# Patient Record
Sex: Male | Born: 2000 | Race: White | Hispanic: No | Marital: Single | Smoking: Never smoker
Health system: Southern US, Community
[De-identification: ages and names within clinical notes are randomized; demographics above are authoritative.]

---
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
## 2018-02-13 ENCOUNTER — Other Ambulatory Visit: Payer: Self-pay | Admitting: Family Medicine

## 2018-02-13 DIAGNOSIS — G43909 Migraine, unspecified, not intractable, without status migrainosus: Secondary | ICD-10-CM

## 2018-02-23 ENCOUNTER — Encounter: Payer: Self-pay | Admitting: Neurology

## 2018-02-24 ENCOUNTER — Ambulatory Visit
Admission: RE | Admit: 2018-02-24 | Discharge: 2018-02-24 | Disposition: A | Discharge status: Home / Self Care | Payer: Commercial Managed Care - PPO | Source: Ambulatory Visit | Attending: Family Medicine | Admitting: Family Medicine

## 2018-02-24 DIAGNOSIS — G43909 Migraine, unspecified, not intractable, without status migrainosus: Secondary | ICD-10-CM

## 2018-03-02 NOTE — Progress Notes (Signed)
NEUROLOGY CONSULTATION NOTE  Randall Chaney MRN: [id number] DOB: 2001/04/29  Referring provider: Nadyne CoombesKaren Richter, MD Primary care provider: Nadyne CoombesKaren Richter, MD  Reason for consult:  migraine  HISTORY OF PRESENT ILLNESS: Randall Chaney is a 17 year old  male who presents for migraines.  History supplemented by referring provider's note.  He is accompanied by his mother who supplements history.  Onset:  Over a year but worse over past few months.  They have become more severe. Location:  bifrontal Quality:  pressure Intensity:  Severe.  He denies thunderclap headache or severe headache that wakes him from sleep. Aura:  no Prodrome:  no Postdrome:  no Associated symptoms:  Nausea (sometimes precedes headache), sometimes vomit, photophobia, phonophobia, turns pale.  He denies associated osmophobia, autonomic symptoms, unilateral numbness or weakness. Duration:  All day Frequency:  Once or twice a month. Last severe migraine was 3 weeks ago but has since had a lingering mild headache with occasional flare ups lasting an hour and occurring once or twice a week. Frequency of abortive medication: 0 Triggers:  No Exacerbating factors:  Lights, sounds, movement, laying on left side Relieving factors:  Being still, sleep Activity:  Aggravates.  Cannot go to schoool  CT Head without contrast from 02/25/18 personally reviewed and demonstrated benign incidental right choroid fissure cyst but otherwise no acute abnormality or secondary etiology for headaches.  Current NSAIDS:  no Current analgesics:  no Current triptans:  no Current ergotamine:  no Current anti-emetic:  no Current muscle relaxants:  no Current anti-anxiolytic:  no Current sleep aide:  no Current Antihypertensive medications:  Propranolol ER 120mg  daily (increased from 60mg  a week ago) Current Antidepressant medications:  no Current Anticonvulsant medications:  no Current anti-CGRP:  no Current  Vitamins/Herbal/Supplements:  no Current Antihistamines/Decongestants:  no Other therapy:  no Other medication:  no  Past NSAIDS:  Aleve, ibuprofen Past analgesics:  Acetaminophen, Excedrin Past abortive triptans:  no Past abortive ergotamine:  no Past muscle relaxants:  no Past anti-emetic:  no Past antihypertensive medications:  no Past antidepressant medications:  no Past anticonvulsant medications:  no Past anti-CGRP:  no Past vitamins/Herbal/Supplements:  no Past antihistamines/decongestants:  no Other past therapies:  no  Caffeine:  No Alcohol:  no Smoker:  no Diet:  hydrates Exercise:  Yes Depression:  no; Anxiety:  no Other pain:  no Sleep hygiene:  Good. Family history of headache:  No  02/09/18 LABS:  CBC with WBC 8.8, H GB 14.7, HCT 46.4, PLT 157; CMP with Na 140, K 4.1, glucose 82, BUN 13, Cr 0.85, t bili 0.7, ALP 102, ALT 12, AST 16; Lyme IgG/IgM negative; RMSF IgG/IgM negative; TSH 1.16.  PAST MEDICAL HISTORY: No PMH  PAST SURGICAL HISTORY: No PSH  MEDICATIONS: Propranolol ER 120mg   ALLERGIES: PCN  FAMILY HISTORY: No significant history.  SOCIAL HISTORY: Social History   Socioeconomic History  . Marital status: Single    Spouse name: Not on file  . Number of children: Not on file  . Years of education: Not on file  . Highest education level: Not on file  Occupational History  . Not on file  Social Needs  . Financial resource strain: Not on file  . Food insecurity:    Worry: Not on file    Inability: Not on file  . Transportation needs:    Medical: Not on file    Non-medical: Not on file  Tobacco Use  . Smoking status: Not on file  Substance and  Sexual Activity  . Alcohol use: Not on file  . Drug use: Not on file  . Sexual activity: Not on file  Lifestyle  . Physical activity:    Days per week: Not on file    Minutes per session: Not on file  . Stress: Not on file  Relationships  . Social connections:    Talks on phone: Not on  file    Gets together: Not on file    Attends religious service: Not on file    Active member of club or organization: Not on file    Attends meetings of clubs or organizations: Not on file    Relationship status: Not on file  . Intimate partner violence:    Fear of current or ex partner: Not on file    Emotionally abused: Not on file    Physically abused: Not on file    Forced sexual activity: Not on file  Other Topics Concern  . Not on file  Social History Narrative  . Not on file    REVIEW OF SYSTEMS: Constitutional: No fevers, chills, or sweats, no generalized fatigue, change in appetite Eyes: No visual changes, double vision, eye pain Ear, nose and throat: No hearing loss, ear pain, nasal congestion, sore throat Cardiovascular: No chest pain, palpitations Respiratory:  No shortness of breath at rest or with exertion, wheezes GastrointestinaI: No nausea, vomiting, diarrhea, abdominal pain, fecal incontinence Genitourinary:  No dysuria, urinary retention or frequency Musculoskeletal:  No neck pain, back pain Integumentary: No rash, pruritus, skin lesions Neurological: as above Psychiatric: No depression, insomnia, anxiety Endocrine: No palpitations, fatigue, diaphoresis, mood swings, change in appetite, change in weight, increased thirst Hematologic/Lymphatic:  No purpura, petechiae. Allergic/Immunologic: no itchy/runny eyes, nasal congestion, recent allergic reactions, rashes  PHYSICAL EXAM: Blood pressure 100/70, pulse 61, height 6' (1.829 m), weight 132 lb (59.9 kg), SpO2 98 %. General: No acute distress.  Patient appears well-groomed.  Head:  Normocephalic/atraumatic Eyes:  fundi examined but not visualized Neck: supple, no paraspinal tenderness, full range of motion Back: No paraspinal tenderness Heart: regular rate and rhythm Lungs: Clear to auscultation bilaterally. Vascular: No carotid bruits. Neurological Exam: Mental status: alert and oriented to person,  place, and time, recent and remote memory intact, fund of knowledge intact, attention and concentration intact, speech fluent and not dysarthric, language intact. Cranial nerves: CN I: not tested CN II: pupils equal, round and reactive to light, visual fields intact CN III, IV, VI:  full range of motion, no nystagmus, no ptosis CN V: facial sensation intact CN VII: upper and lower face symmetric CN VIII: hearing intact CN IX, X: gag intact, uvula midline CN XI: sternocleidomastoid and trapezius muscles intact CN XII: tongue midline Bulk & Tone: normal, no fasciculations. Motor:  5/5 throughout  Sensation: temperature and vibration sensation intact. Deep Tendon Reflexes:  2+ throughout, toes downgoing.  Finger to nose testing:  Without dysmetria.  Heel to shin:  Without dysmetria.  Gait:  Normal station and stride.  Able to turn and tandem walk. Romberg negative.  IMPRESSION: Intractable migraine without aura, without status migrainosus  PLAN: 1.  Continue propranolol ER 120mg  daily.  Call in 4 weeks with update and if still frequent, we can change to topiramate 50mg  at bedtime 2.  Take sumatriptan 100mg  for abortive therapy and Zofran ODT 4mg  for nausea 3.  Limit use of pain relievers to no more than 2 days out of the week.  These medications include acetaminophen, ibuprofen, triptans and narcotics.  This will help reduce risk of rebound headaches. 4.  Be aware of common food triggers such as processed sweets, processed foods with nitrites (such as deli meat, hot dogs, sausages), foods with MSG, alcohol (such as wine), chocolate, certain cheeses, certain fruits (dried fruits, bananas, some citrus fruit), vinegar, diet soda. 4.  Avoid caffeine 5.  Routine exercise 6.  Proper sleep hygiene 7.  Stay adequately hydrated with water 8.  Keep a headache diary. 9.  Maintain proper stress management. 10.  Do not skip meals. 11.  Consider supplements:  Magnesium citrate 400mg  to 600mg  daily,  riboflavin 400mg , Coenzyme Q 10 100mg  three times daily 12.  Due to the persistence of his headache, CTA of head to rule out vascular etiology.  Thank you for allowing me to take part in the care of this patient.  Shon Millet, DO  CC: Nadyne Coombes, MD

## 2018-03-03 ENCOUNTER — Ambulatory Visit: Payer: Commercial Managed Care - PPO | Admitting: Neurology

## 2018-03-03 ENCOUNTER — Encounter: Payer: Self-pay | Admitting: Neurology

## 2018-03-03 VITALS — BP 100/70 | HR 61 | Ht 72.0 in | Wt 132.0 lb

## 2018-03-03 DIAGNOSIS — G4452 New daily persistent headache (NDPH): Secondary | ICD-10-CM | POA: Diagnosis not present

## 2018-03-03 DIAGNOSIS — G43019 Migraine without aura, intractable, without status migrainosus: Secondary | ICD-10-CM

## 2018-03-03 MED ORDER — PROPRANOLOL HCL ER 120 MG PO CP24: 120.0000 mg | ORAL_CAPSULE | Freq: Every day | ORAL | 3 refills | Status: DC

## 2018-03-03 MED ORDER — SUMATRIPTAN SUCCINATE 100 MG PO TABS: ORAL_TABLET | ORAL | 2 refills | Status: DC

## 2018-03-03 MED ORDER — ONDANSETRON 4 MG PO TBDP: 4.0000 mg | ORAL_TABLET | Freq: Three times a day (TID) | ORAL | 2 refills | Status: DC | PRN

## 2018-03-03 NOTE — Patient Instructions (Signed)
Migraine Recommendations: 1.  Continue propranolol ER 120mg  daily.  Contact in 4 weeks with update and we can switch medication if needed. 2.  Take sumatriptan 100mg  at earliest onset of headache.  May repeat dose once in 2 hours if needed.  Do not exceed two tablets in 24 hours.  For nausea, take ondansetron (Zofran ODT) 4mg  as directed. 3.  You may take Excedrin if needed.  But limit use of pain relievers to no more than 2 days out of the week.  These medications include acetaminophen, ibuprofen, triptans and narcotics.  This will help reduce risk of rebound headaches. 4.  Be aware of common food triggers such as processed sweets, processed foods with nitrites (such as deli meat, hot dogs, sausages), foods with MSG, alcohol (such as wine), chocolate, certain cheeses, certain fruits (dried fruits, bananas, some citrus fruit), vinegar, diet soda. 4.  Avoid caffeine 5.  Routine exercise 6.  Proper sleep hygiene 7.  Stay adequately hydrated with water 8.  Keep a headache diary. 9.  Maintain proper stress management. 10.  Do not skip meals. 11.  Consider supplements:  Magnesium citrate 400mg  to 600mg  daily, riboflavin 400mg , Coenzyme Q 10 100mg  three times daily 12.  We will check CTA of head 13.  Follow up in 3 to 4 months.   Migraine Headache A migraine headache is an intense, throbbing pain on one side or both sides of the head. Migraines may also cause other symptoms, such as nausea, vomiting, and sensitivity to light and noise. What are the causes? Doing or taking certain things may also trigger migraines, such as:  Alcohol.  Smoking.  Medicines, such as: ? Medicine used to treat chest pain (nitroglycerine). ? Birth control pills. ? Estrogen pills. ? Certain blood pressure medicines.  Aged cheeses, chocolate, or caffeine.  Foods or drinks that contain nitrates, glutamate, aspartame, or tyramine.  Physical activity.  Other things that may trigger a migraine  include:  Menstruation.  Pregnancy.  Hunger.  Stress, lack of sleep, too much sleep, or fatigue.  Weather changes.  What increases the risk? The following factors may make you more likely to experience migraine headaches:  Age. Risk increases with age.  Family history of migraine headaches.  Being Caucasian.  Depression and anxiety.  Obesity.  Being a woman.  Having a hole in the heart (patent foramen ovale) or other heart problems.  What are the signs or symptoms? The main symptom of this condition is pulsating or throbbing pain. Pain may:  Happen in any area of the head, such as on one side or both sides.  Interfere with daily activities.  Get worse with physical activity.  Get worse with exposure to bright lights or loud noises.  Other symptoms may include:  Nausea.  Vomiting.  Dizziness.  General sensitivity to bright lights, loud noises, or smells.  Before you get a migraine, you may get warning signs that a migraine is developing (aura). An aura may include:  Seeing flashing lights or having blind spots.  Seeing bright spots, halos, or zigzag lines.  Having tunnel vision or blurred vision.  Having numbness or a tingling feeling.  Having trouble talking.  Having muscle weakness.  How is this diagnosed? A migraine headache can be diagnosed based on:  Your symptoms.  A physical exam.  Tests, such as CT scan or MRI of the head. These imaging tests can help rule out other causes of headaches.  Taking fluid from the spine (lumbar puncture) and analyzing it (  cerebrospinal fluid analysis, or CSF analysis).  How is this treated? A migraine headache is usually treated with medicines that:  Relieve pain.  Relieve nausea.  Prevent migraines from coming back.  Treatment may also include:  Acupuncture.  Lifestyle changes like avoiding foods that trigger migraines.  Follow these instructions at home: Medicines  Take over-the-counter  and prescription medicines only as told by your health care provider.  Do not drive or use heavy machinery while taking prescription pain medicine.  To prevent or treat constipation while you are taking prescription pain medicine, your health care provider may recommend that you: ? Drink enough fluid to keep your urine clear or pale yellow. ? Take over-the-counter or prescription medicines. ? Eat foods that are high in fiber, such as fresh fruits and vegetables, whole grains, and beans. ? Limit foods that are high in fat and processed sugars, such as fried and sweet foods. Lifestyle  Avoid alcohol use.  Do not use any products that contain nicotine or tobacco, such as cigarettes and e-cigarettes. If you need help quitting, ask your health care provider.  Get at least 8 hours of sleep every night.  Limit your stress. General instructions   Keep a journal to find out what may trigger your migraine headaches. For example, write down: ? What you eat and drink. ? How much sleep you get. ? Any change to your diet or medicines.  If you have a migraine: ? Avoid things that make your symptoms worse, such as bright lights. ? It may help to lie down in a dark, quiet room. ? Do not drive or use heavy machinery. ? Ask your health care provider what activities are safe for you while you are experiencing symptoms.  Keep all follow-up visits as told by your health care provider. This is important. Contact a health care provider if:  You develop symptoms that are different or more severe than your usual migraine symptoms. Get help right away if:  Your migraine becomes severe.  You have a fever.  You have a stiff neck.  You have vision loss.  Your muscles feel weak or like you cannot control them.  You start to lose your balance often.  You develop trouble walking.  You faint. This information is not intended to replace advice given to you by your health care provider. Make sure  you discuss any questions you have with your health care provider. Document Released: 07/01/2005 Document Revised: 01/19/2016 Document Reviewed: 12/18/2015 Elsevier Interactive Patient Education  2017 [employer]

## 2018-03-13 ENCOUNTER — Ambulatory Visit
Admission: RE | Admit: 2018-03-13 | Discharge: 2018-03-13 | Disposition: A | Discharge status: Home / Self Care | Payer: Commercial Managed Care - PPO | Source: Ambulatory Visit | Attending: Neurology | Admitting: Neurology

## 2018-03-13 DIAGNOSIS — G4452 New daily persistent headache (NDPH): Secondary | ICD-10-CM

## 2018-03-13 MED ORDER — IOPAMIDOL (ISOVUE-370) INJECTION 76%
75.0000 mL | Freq: Once | INTRAVENOUS | Status: AC | PRN
  Administered 2018-03-13: 75 mL via INTRAVENOUS

## 2018-03-24 ENCOUNTER — Telehealth: Payer: Self-pay | Admitting: *Deleted

## 2018-03-24 NOTE — Telephone Encounter (Signed)
Left message giving patient results.  

## 2018-03-24 NOTE — Telephone Encounter (Signed)
-----   Message from Vivien Rota, LPN sent at 12/16/8935 12:32 PM EDT ----- Please call patient.  ----- Message ----- From: Vivien Rota, LPN Sent: 09/16/2874   7:58 AM EDT To: Vonzell Schlatter Arbell Wycoff, LPN    ----- Message ----- From: Drema Dallas, DO Sent: 03/17/2018   6:29 AM EDT To: Dorthy Cooler, CMA  CT/CTA of head is normal

## 2018-07-27 ENCOUNTER — Encounter

## 2018-07-27 ENCOUNTER — Ambulatory Visit: Payer: Commercial Managed Care - PPO | Admitting: Neurology

## 2019-03-03 IMAGING — CT CT ANGIO HEAD
3 of 11 series · 15 of 47 positions shown · IV contrast (iopamidol)
Comparison: CT HEAD February 24, 2018

CLINICAL DATA: Headache and occasional dizziness for a year,
worsening for 1 month.

EXAM:
CT ANGIOGRAPHY HEAD
TECHNIQUE: Multidetector CT imaging of the head was performed using the
standard protocol during bolus administration of intravenous
contrast. Multiplanar CT image reconstructions and MIPs were
obtained to evaluate the vascular anatomy.
CONTRAST:  75mL RFRKIP-32M IOPAMIDOL (RFRKIP-32M) INJECTION 76%

[Series 14: cta brain 1.00 hv48 ax thin mips · axial · 0.40mm/px · z∈[-522,-388]mm · 9 of 168 slices shown]
[im 17/168  brain]
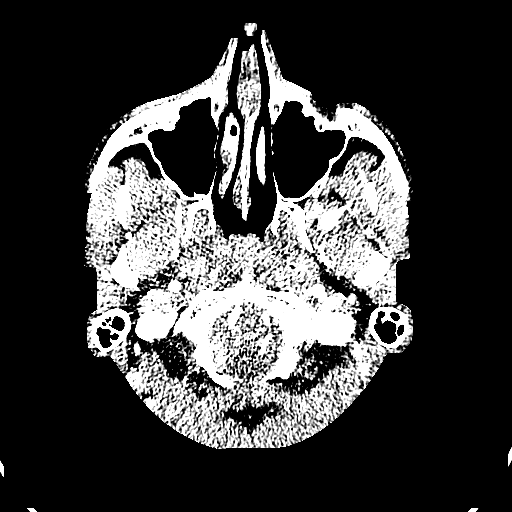
[im 34/168  bone]
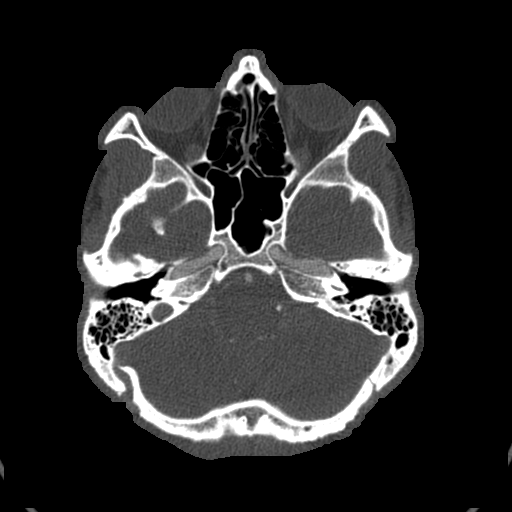
[im 51/168  brain]
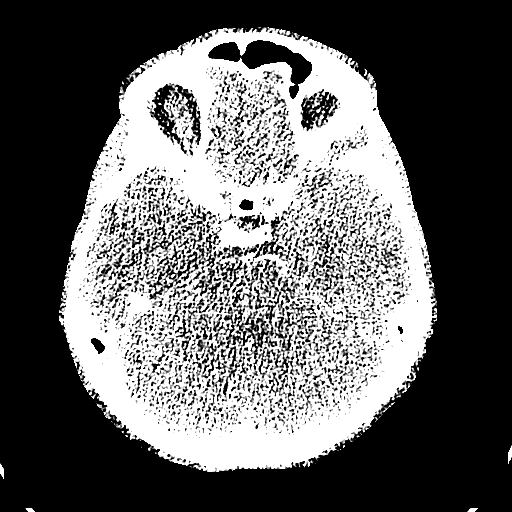
[im 67/168  bone]
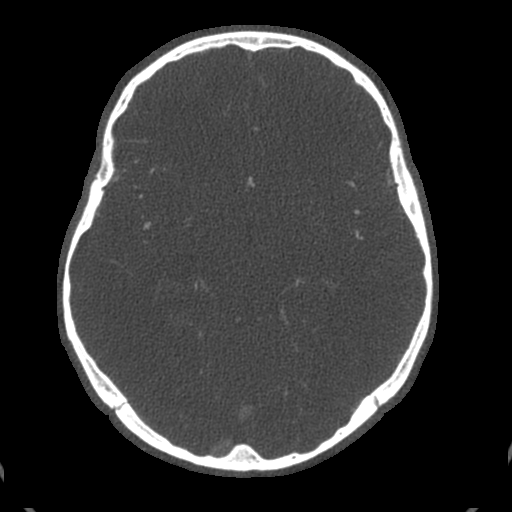
[im 84/168  brain]
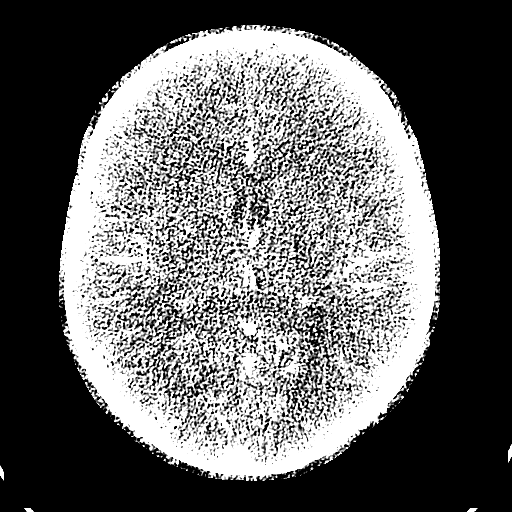
[im 101/168  bone]
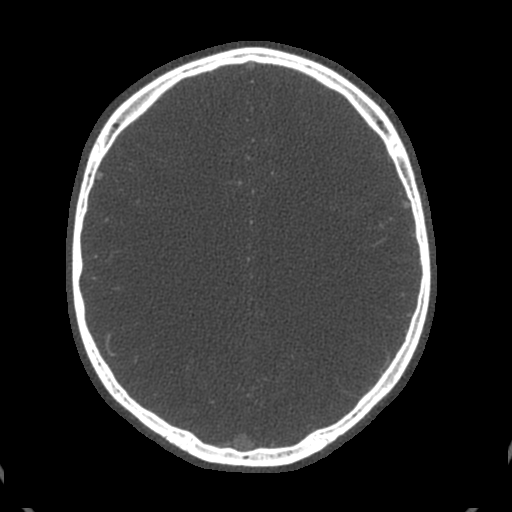
[im 117/168  brain]
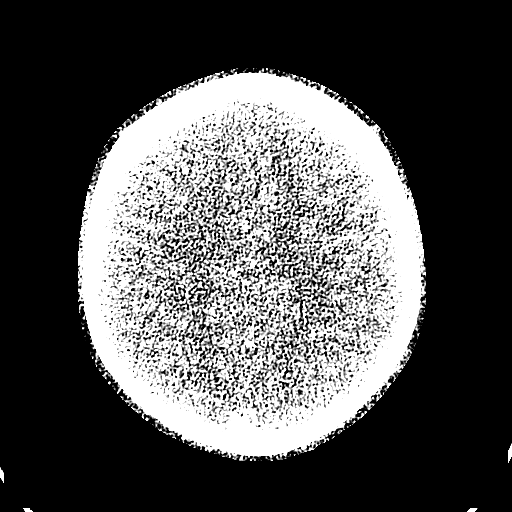
[im 134/168  bone]
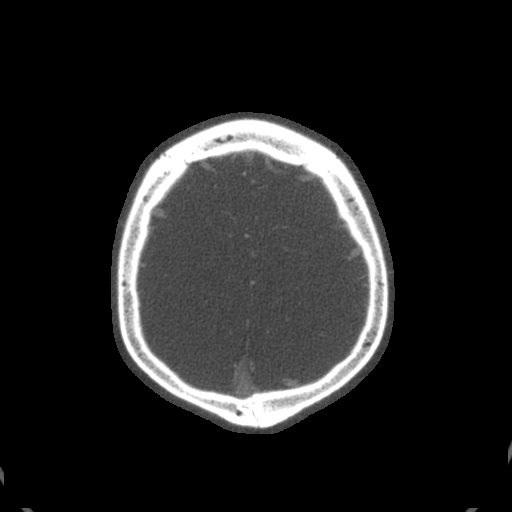
[im 151/168  brain]
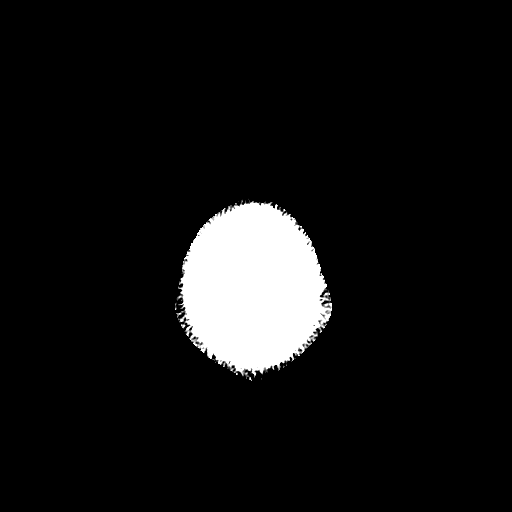

[Series 15: cta brain 1.00 hv48 cor cor thin mips · coronal · 0.33mm/px · 3 of 206 slices shown]
[im 59/206  brain]
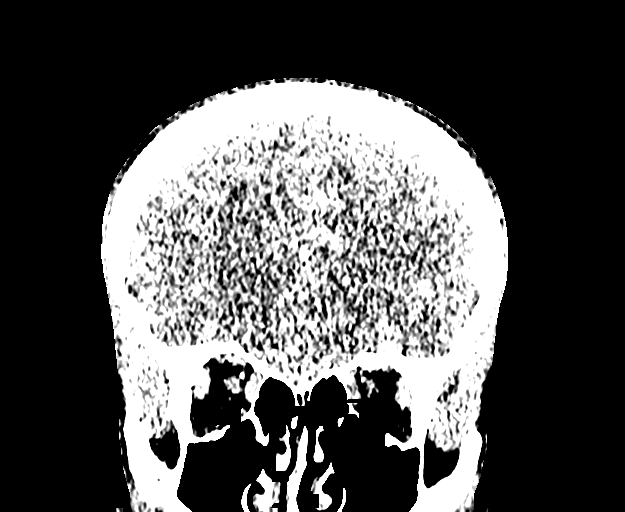
[im 88/206  brain]
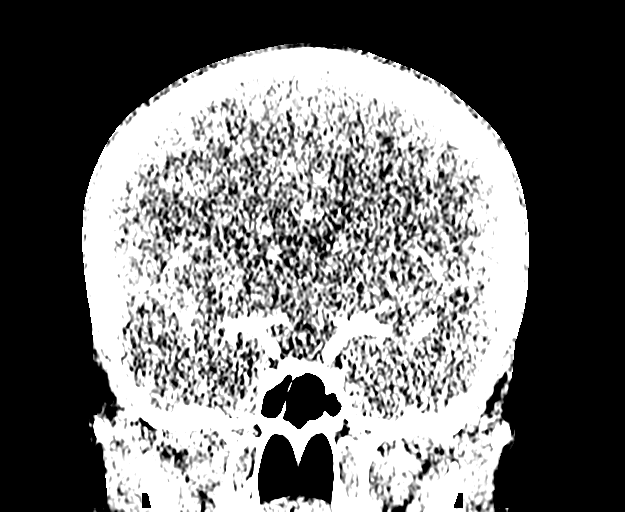
[im 118/206  brain]
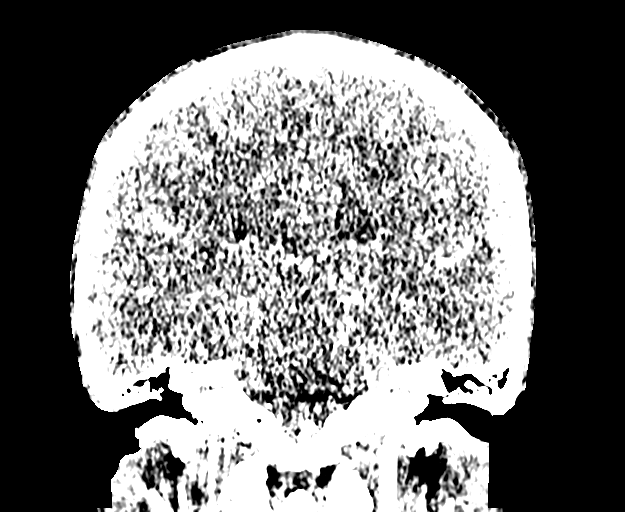

[Series 17: cta brain 1.00 hv48 sag sag thin mips · sagittal · 0.33mm/px · 3 of 206 slices shown]
[im 52/206  brain]
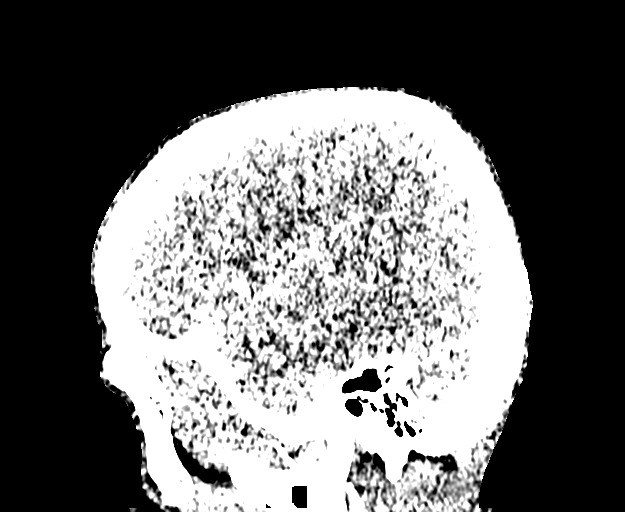
[im 103/206  brain]
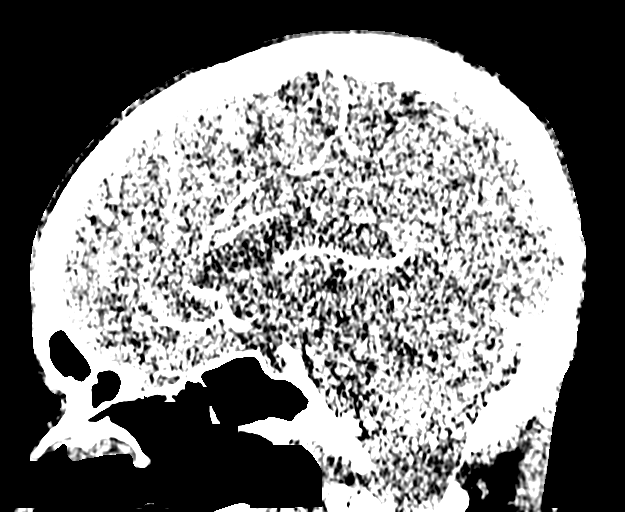
[im 154/206  brain]
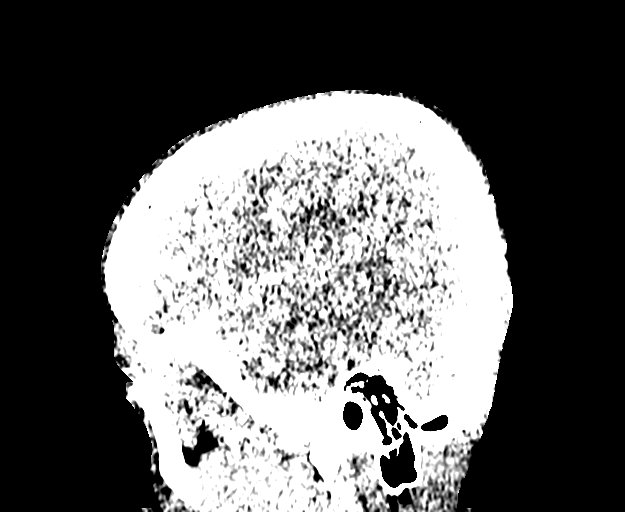

[15 of 47 positions shown; findings below may reference images not displayed]

FINDINGS: CT HEAD

BRAIN: No intraparenchymal hemorrhage, mass effect nor midline
shift. The ventricles and sulci are normal. No acute large vascular
territory infarcts. RIGHT choroidal fissure cyst, incidental
finding. No abnormal extra-axial fluid collections. Basal cisterns
are patent.

VASCULAR: Unremarkable.

SKULL/SOFT TISSUES: No skull fracture. No significant soft tissue
swelling.

ORBITS/SINUSES: The included ocular globes and orbital contents are
normal.Trace paranasal sinus mucosal thickening. Mastoid air cells
are well aerated.

OTHER: None.

CTA HEAD

ANTERIOR CIRCULATION: What fat new trucker phase

No large vessel occlusion, significant stenosis, contrast
extravasation or aneurysm.

POSTERIOR CIRCULATION: Patent vertebral arteries, vertebrobasilar
junction and basilar artery, as well as main branch vessels. Patent
posterior cerebral arteries. Small bilateral posterior communicating
arteries present.

No large vessel occlusion, significant stenosis, contrast
extravasation or aneurysm.

VENOUS SINUSES: Major dural venous sinuses are patent though not
tailored for evaluation on this angiographic examination.

ANATOMIC VARIANTS: None.

DELAYED PHASE: No abnormal intracranial enhancement.

MIP images reviewed.
IMPRESSION: 1. Normal CT HEAD with and without contrast.
2. Normal CTA HEAD.

## 2020-01-20 ENCOUNTER — Ambulatory Visit
Admission: EM | Admit: 2020-01-20 | Discharge: 2020-01-20 | Disposition: A | Discharge status: Home / Self Care | Payer: Commercial Managed Care - PPO | Attending: Emergency Medicine | Admitting: Emergency Medicine

## 2020-01-20 DIAGNOSIS — J069 Acute upper respiratory infection, unspecified: Secondary | ICD-10-CM

## 2020-01-20 MED ORDER — CETIRIZINE HCL 10 MG PO TABS: 10.0000 mg | ORAL_TABLET | Freq: Every day | ORAL | 0 refills | Status: AC

## 2020-01-20 MED ORDER — BENZONATATE 100 MG PO CAPS: 100.0000 mg | ORAL_CAPSULE | Freq: Three times a day (TID) | ORAL | 0 refills | Status: AC

## 2020-01-20 MED ORDER — FLUTICASONE PROPIONATE 50 MCG/ACT NA SUSP: 1.0000 | Freq: Every day | NASAL | 0 refills | Status: AC

## 2020-01-20 NOTE — Discharge Instructions (Signed)

## 2020-01-20 NOTE — ED Provider Notes (Signed)
[facility]    CSN: [id number] Arrival date & time: 01/20/20  1053      History   Chief Complaint Chief Complaint  Patient presents with  . Nasal Congestion    HPI Randall Chaney is a 19 y.o. male presenting for URI symptoms x3 days.  Endorsing dry cough, nasal congestion, scratchy throat.  No difficulty breathing, swelling, drooling, dental pain.  No fever, productive cough, shortness of breath or chest pain.  Has tried Tylenol and an OTC nasal spray (unsure what type) with some relief.  No known sick contacts, did not receive Covid vaccine.   History reviewed. No pertinent past medical history.  There are no problems to display for this patient.   History reviewed. No pertinent surgical history.     Home Medications    Prior to Admission medications   Medication Sig Start Date End Date Taking? Authorizing Provider  benzonatate (TESSALON) 100 MG capsule Take 1 capsule (100 mg total) by mouth every 8 (eight) hours. 01/20/20   Hall-Potvin, [place], PA-C  cetirizine (ZYRTEC ALLERGY) 10 MG tablet Take 1 tablet (10 mg total) by mouth daily. 01/20/20   Hall-Potvin, [place], PA-C  fluticasone (FLONASE) 50 MCG/ACT nasal spray Place 1 spray into both nostrils daily. 01/20/20   Hall-Potvin, [place], PA-C    Family History History reviewed. No pertinent family history.  Social History Social History   Tobacco Use  . Smoking status: Never Smoker  . Smokeless tobacco: Never Used  Vaping Use  . Vaping Use: Never used  Substance Use Topics  . Alcohol use: Never  . Drug use: Never     Allergies   Patient has no known allergies.   Review of Systems As per HPI   Physical Exam Triage Vital Signs ED Triage Vitals  Enc Vitals Group     BP      Pulse      Resp      Temp      Temp src      SpO2      Weight      Height      Head Circumference      Peak Flow      Pain Score      Pain Loc      Pain Edu?      Excl. in GC?    No data found.  Updated  Vital Signs BP 116/77 (BP Location: Left Arm)   Pulse 77   Temp 98.1 F (36.7 C) (Oral)   Resp 18   SpO2 96%   Visual Acuity Right Eye Distance:   Left Eye Distance:   Bilateral Distance:    Right Eye Near:   Left Eye Near:    Bilateral Near:     Physical Exam Constitutional:      General: He is not in acute distress.    Appearance: He is normal weight. He is not toxic-appearing or diaphoretic.  HENT:     Head: Normocephalic and atraumatic.     Right Ear: Tympanic membrane, ear canal and external ear normal.     Left Ear: Tympanic membrane, ear canal and external ear normal.     Nose: Nose normal.     Mouth/Throat:     Mouth: Mucous membranes are moist.     Pharynx: Oropharynx is clear.  Eyes:     General: No scleral icterus.    Conjunctiva/sclera: Conjunctivae normal.     Pupils: Pupils are equal, round, and reactive to light.  Neck:     Comments: Trachea midline, negative JVD Cardiovascular:     Rate and Rhythm: Normal rate and regular rhythm.  Pulmonary:     Effort: Pulmonary effort is normal. No respiratory distress.     Breath sounds: No wheezing.  Musculoskeletal:     Cervical back: Neck supple. No tenderness.  Lymphadenopathy:     Cervical: No cervical adenopathy.  Skin:    Capillary Refill: Capillary refill takes less than 2 seconds.     Coloration: Skin is not jaundiced or pale.     Findings: No rash.  Neurological:     Mental Status: He is alert and oriented to person, place, and time.      UC Treatments / Results  Labs (all labs ordered are listed, but only abnormal results are displayed) Labs Reviewed  NOVEL CORONAVIRUS, NAA    EKG   Radiology No results found.  Procedures Procedures (including critical care time)  Medications Ordered in UC Medications - No data to display  Initial Impression / Assessment and Plan / UC Course  I have reviewed the triage vital signs and the nursing notes.  Pertinent labs & imaging results that  were available during my care of the patient were reviewed by me and considered in my medical decision making (see chart for details).     Patient afebrile, nontoxic, with SpO2 96%.  Covid PCR pending.  Patient to quarantine until results are back.  We will treat supportively as outlined below.  Return precautions discussed, patient verbalized understanding and is agreeable to plan. Final Clinical Impressions(s) / UC Diagnoses   Final diagnoses:  URI with cough and congestion     Discharge Instructions     Tessalon for cough. Start flonase, atrovent nasal spray for nasal congestion/drainage. You can use over the counter nasal saline rinse such as neti pot for nasal congestion. Keep hydrated, your urine should be clear to pale yellow in color. Tylenol/motrin for fever and pain. Monitor for any worsening of symptoms, chest pain, shortness of breath, wheezing, swelling of the throat, go to the emergency department for further evaluation needed.     ED Prescriptions    Medication Sig Dispense Auth. Provider   cetirizine (ZYRTEC ALLERGY) 10 MG tablet Take 1 tablet (10 mg total) by mouth daily. 30 tablet Hall-Potvin, [place], PA-C   fluticasone (FLONASE) 50 MCG/ACT nasal spray Place 1 spray into both nostrils daily. 16 g Hall-Potvin, [place], PA-C   benzonatate (TESSALON) 100 MG capsule Take 1 capsule (100 mg total) by mouth every 8 (eight) hours. 21 capsule Hall-Potvin, [place], PA-C     PDMP not reviewed this encounter.   Hall-Potvin, [place], [place] 01/20/20 1222

## 2020-01-20 NOTE — ED Triage Notes (Signed)
Pt c/o dry cough, nasal congestion, and sore throat x3 days. States taking a decongestant with relief.

## 2020-01-21 LAB — SARS-COV-2, NAA 2 DAY TAT

## 2020-01-21 LAB — NOVEL CORONAVIRUS, NAA: SARS-CoV-2, NAA: NOT DETECTED
